# Patient Record
Sex: Female | Born: 1970 | Race: Black or African American | Hispanic: No | Marital: Single | Smoking: Never smoker
Health system: Southern US, Community
[De-identification: ages and names within clinical notes are randomized; demographics above are authoritative.]

## PROBLEM LIST (undated history)

## (undated) DIAGNOSIS — F419 Anxiety disorder, unspecified: Secondary | ICD-10-CM

## (undated) DIAGNOSIS — J45909 Unspecified asthma, uncomplicated: Secondary | ICD-10-CM

## (undated) DIAGNOSIS — I10 Essential (primary) hypertension: Secondary | ICD-10-CM

## (undated) HISTORY — PX: KNEE SURGERY: SHX244

## (undated) HISTORY — PX: ANKLE SURGERY: SHX546

---

## 2013-10-26 ENCOUNTER — Emergency Department: Payer: Self-pay | Admitting: Emergency Medicine

## 2013-10-26 LAB — CBC
HCT: 42.5 % (ref 35.0–47.0)
HGB: 13.2 g/dL (ref 12.0–16.0)
MCH: 25 pg — ABNORMAL LOW (ref 26.0–34.0)
MCHC: 31.1 g/dL — AB (ref 32.0–36.0)
MCV: 80 fL (ref 80–100)
Platelet: 278 10*3/uL (ref 150–440)
RBC: 5.3 10*6/uL — ABNORMAL HIGH (ref 3.80–5.20)
RDW: 16.9 % — AB (ref 11.5–14.5)
WBC: 13 10*3/uL — ABNORMAL HIGH (ref 3.6–11.0)

## 2013-10-26 LAB — COMPREHENSIVE METABOLIC PANEL
ALK PHOS: 190 U/L — AB
ALT: 28 U/L (ref 12–78)
Albumin: 4 g/dL (ref 3.4–5.0)
Anion Gap: 7 (ref 7–16)
BUN: 9 mg/dL (ref 7–18)
Bilirubin,Total: 0.2 mg/dL (ref 0.2–1.0)
CREATININE: 0.88 mg/dL (ref 0.60–1.30)
Calcium, Total: 9.2 mg/dL (ref 8.5–10.1)
Chloride: 101 mmol/L (ref 98–107)
Co2: 28 mmol/L (ref 21–32)
EGFR (African American): >60
Glucose: 137 mg/dL — ABNORMAL HIGH (ref 65–99)
Osmolality: 273 (ref 275–301)
Potassium: 4.1 mmol/L (ref 3.5–5.1)
SGOT(AST): 25 U/L (ref 15–37)
SODIUM: 136 mmol/L (ref 136–145)
Total Protein: 9.2 g/dL — ABNORMAL HIGH (ref 6.4–8.2)

## 2013-10-26 LAB — TROPONIN I: Troponin-I: <0.02 ng/mL

## 2014-05-03 ENCOUNTER — Emergency Department: Payer: Self-pay | Admitting: Emergency Medicine

## 2014-05-03 LAB — CBC WITH DIFFERENTIAL/PLATELET
BASOS ABS: 0.1 10*3/uL (ref 0.0–0.1)
BASOS PCT: 0.6 %
EOS PCT: 2 %
Eosinophil #: 0.3 10*3/uL (ref 0.0–0.7)
HCT: 42 % (ref 35.0–47.0)
HGB: 12.6 g/dL (ref 12.0–16.0)
LYMPHS ABS: 5 10*3/uL — AB (ref 1.0–3.6)
Lymphocyte %: 36.6 %
MCH: 25 pg — ABNORMAL LOW (ref 26.0–34.0)
MCHC: 30 g/dL — ABNORMAL LOW (ref 32.0–36.0)
MCV: 83 fL (ref 80–100)
Monocyte #: 1.1 x10 3/mm — ABNORMAL HIGH (ref 0.2–0.9)
Monocyte %: 8.2 %
NEUTROS PCT: 52.6 %
Neutrophil #: 7.2 10*3/uL — ABNORMAL HIGH (ref 1.4–6.5)
PLATELETS: 342 10*3/uL (ref 150–440)
RBC: 5.03 10*6/uL (ref 3.80–5.20)
RDW: 16.5 % — AB (ref 11.5–14.5)
WBC: 13.6 10*3/uL — ABNORMAL HIGH (ref 3.6–11.0)

## 2014-05-03 LAB — BASIC METABOLIC PANEL
ANION GAP: 9 (ref 7–16)
BUN: 12 mg/dL (ref 7–18)
CALCIUM: 8.5 mg/dL (ref 8.5–10.1)
Chloride: 105 mmol/L (ref 98–107)
Co2: 28 mmol/L (ref 21–32)
Creatinine: 0.94 mg/dL (ref 0.60–1.30)
EGFR (African American): >60
EGFR (Non-African Amer.): >60
GLUCOSE: 118 mg/dL — AB (ref 65–99)
Osmolality: 284 (ref 275–301)
Potassium: 3.7 mmol/L (ref 3.5–5.1)
Sodium: 142 mmol/L (ref 136–145)

## 2014-05-03 LAB — D-DIMER(ARMC): D-Dimer: 1050 ng/ml

## 2014-05-03 LAB — TROPONIN I: Troponin-I: <0.02 ng/mL

## 2014-05-03 LAB — HCG, QUANTITATIVE, PREGNANCY: Beta Hcg, Quant.: <1 m[IU]/mL — ABNORMAL LOW

## 2016-03-08 ENCOUNTER — Encounter: Payer: Self-pay | Admitting: Emergency Medicine

## 2016-03-08 ENCOUNTER — Emergency Department
Admission: EM | Admit: 2016-03-08 | Discharge: 2016-03-09 | Disposition: A | Discharge status: Home / Self Care | Payer: BC Managed Care – PPO | Attending: Emergency Medicine | Admitting: Emergency Medicine

## 2016-03-08 DIAGNOSIS — J45909 Unspecified asthma, uncomplicated: Secondary | ICD-10-CM | POA: Diagnosis not present

## 2016-03-08 DIAGNOSIS — I10 Essential (primary) hypertension: Secondary | ICD-10-CM | POA: Insufficient documentation

## 2016-03-08 DIAGNOSIS — R109 Unspecified abdominal pain: Secondary | ICD-10-CM | POA: Diagnosis not present

## 2016-03-08 HISTORY — DX: Unspecified asthma, uncomplicated: J45.909

## 2016-03-08 HISTORY — DX: Anxiety disorder, unspecified: F41.9

## 2016-03-08 HISTORY — DX: Essential (primary) hypertension: I10

## 2016-03-08 LAB — URINALYSIS COMPLETE WITH MICROSCOPIC (ARMC ONLY)
BILIRUBIN URINE: NEGATIVE
Bacteria, UA: NONE SEEN
GLUCOSE, UA: NEGATIVE mg/dL
HGB URINE DIPSTICK: NEGATIVE
Ketones, ur: NEGATIVE mg/dL
LEUKOCYTES UA: NEGATIVE
NITRITE: NEGATIVE
PH: 7 (ref 5.0–8.0)
Protein, ur: 30 mg/dL — AB
SPECIFIC GRAVITY, URINE: 1.017 (ref 1.005–1.030)

## 2016-03-08 LAB — CBC
HCT: 37.9 % (ref 35.0–47.0)
Hemoglobin: 12.3 g/dL (ref 12.0–16.0)
MCH: 24.8 pg — AB (ref 26.0–34.0)
MCHC: 32.5 g/dL (ref 32.0–36.0)
MCV: 76.4 fL — AB (ref 80.0–100.0)
PLATELETS: 280 10*3/uL (ref 150–440)
RBC: 4.97 MIL/uL (ref 3.80–5.20)
RDW: 18.5 % — AB (ref 11.5–14.5)
WBC: 12 10*3/uL — ABNORMAL HIGH (ref 3.6–11.0)

## 2016-03-08 LAB — BASIC METABOLIC PANEL
Anion gap: 5 (ref 5–15)
BUN: 9 mg/dL (ref 6–20)
CHLORIDE: 105 mmol/L (ref 101–111)
CO2: 29 mmol/L (ref 22–32)
CREATININE: 0.76 mg/dL (ref 0.44–1.00)
Calcium: 9.1 mg/dL (ref 8.9–10.3)
GFR calc Af Amer: >60 mL/min (ref >60)
GFR calc non Af Amer: >60 mL/min (ref >60)
GLUCOSE: 97 mg/dL (ref 65–99)
Potassium: 3.6 mmol/L (ref 3.5–5.1)
Sodium: 139 mmol/L (ref 135–145)

## 2016-03-08 NOTE — ED Triage Notes (Signed)
Patient with complaint of right lower back pain that she has had times 2 weeks. Patient reports that the pain is becoming worse. Patient states that she was seen at an emergency room about 2 weeks ago and by her pcp one week ago and was diagnosed with flank pain.

## 2016-03-09 ENCOUNTER — Emergency Department: Payer: BC Managed Care – PPO

## 2016-03-09 NOTE — ED Provider Notes (Signed)
[facility] Emergency Department Provider Note   First MD Initiated Contact with Patient 03/09/16 [phone]     (approximate)  I have reviewed the triage vital signs and the nursing notes.   HISTORY  Chief Complaint Back Pain  HPI Ramisa Senaida OresRichardson is a 45 y.o. female resents with 8 out of 10 right flank pain that is described as aching 2 weeks. Patient denies any history of trauma. Patient states that she was seen in the emergency department [facility] 2 weeks ago and subsequently by her primary care provider one week ago for the same and diagnosed with right flank pain. Patient states she was prescribed naproxen and Skelaxin however she did not take the Skelaxin because she was concerned that this might worsen her anxiety Past Medical History:  Diagnosis Date  . Anxiety   . Asthma   . Hypertension     There are no active problems to display for this patient.   Past Surgical History:  Procedure Laterality Date  . ANKLE SURGERY    . KNEE SURGERY      Prior to Admission medications   Not on File    Allergies Shellfish allergy  No family history on file.  Social History Social History  Substance Use Topics  . Smoking status: Never Smoker  . Smokeless tobacco: Never Used  . Alcohol use No    Review of Systems Constitutional: No fever/chills Eyes: No visual changes. ENT: No sore throat. Cardiovascular: Denies chest pain. Respiratory: Denies shortness of breath. Gastrointestinal: No abdominal pain.  No nausea, no vomiting.  No diarrhea.  No constipation. Genitourinary: Negative for dysuria. Musculoskeletal:Positive for right flank pain Skin: Negative for rash. Neurological: Negative for headaches, focal weakness or numbness.  10-point ROS otherwise negative.  ____________________________________________   PHYSICAL EXAM:  VITAL SIGNS: ED Triage Vitals  Enc Vitals Group     BP 03/08/16 2135 (!) 171/110     Pulse Rate 03/08/16 2135 95       Resp 03/08/16 2135 18     Temp 03/08/16 2135 98.1 F (36.7 C)     Temp Source 03/08/16 2135 Oral     SpO2 03/08/16 2135 97 %     Weight 03/08/16 2135 262 lb (118.8 kg)     Height 03/08/16 2135 5\' 1"  (1.549 m)     Head Circumference --      Peak Flow --      Pain Score 03/08/16 2136 8     Pain Loc --      Pain Edu? --      Excl. in GC? --     Constitutional: Alert and oriented. Well appearing and in no acute distress. Eyes: Conjunctivae are normal. PERRL. EOMI. Head: Atraumatic. Mouth/Throat: Mucous membranes are moist.  Oropharynx non-erythematous. Neck: No stridor.  No meningeal signs.  Cardiovascular: Normal rate, regular rhythm. Good peripheral circulation. Grossly normal heart sounds. Respiratory: Normal respiratory effort.  No retractions. Lungs CTAB. Gastrointestinal: Soft and nontender. No distention.  Musculoskeletal: No lower extremity tenderness nor edema. No gross deformities of extremities. Neurologic:  Normal speech and language. No gross focal neurologic deficits are appreciated.  Skin:  Skin is warm, dry and intact. No rash noted. Psychiatric: Mood and affect are normal. Speech and behavior are normal.  ____________________________________________   LABS (all labs ordered are listed, but only abnormal results are displayed)  Labs Reviewed  URINALYSIS COMPLETEWITH MICROSCOPIC ([facility] ONLY) - Abnormal; Notable for the following:       Result Value  Color, Urine YELLOW (*)    APPearance CLEAR (*)    Protein, ur 30 (*)    Squamous Epithelial / LPF 0-5 (*)    All other components within normal limits  CBC - Abnormal; Notable for the following:    WBC 12.0 (*)    MCV 76.4 (*)    MCH 24.8 (*)    RDW 18.5 (*)    All other components within normal limits  BASIC METABOLIC PANEL    RADIOLOGY I, [place] N Chasitie Passey, personally viewed and evaluated these images (plain radiographs) as part of my medical decision making, as well as reviewing the written report by  the radiologist.  Ct Renal Stone Study  Result Date: 03/09/2016 CLINICAL DATA:  Acute onset of right back pain.  Initial encounter. EXAM: CT ABDOMEN AND PELVIS WITHOUT CONTRAST TECHNIQUE: Multidetector CT imaging of the abdomen and pelvis was performed following the standard protocol without IV contrast. COMPARISON:  None. FINDINGS: Lower chest: The visualized lung bases are grossly clear. The visualized portions of the mediastinum are unremarkable. Hepatobiliary: The liver is unremarkable in appearance. The gallbladder is unremarkable in appearance. The common bile duct remains normal in caliber. Pancreas: The pancreas is grossly unremarkable in appearance. Spleen: The spleen is within normal limits. Adrenals/Urinary Tract: The adrenal glands are within normal limits. A 5.1 cm left renal cyst is noted. The kidneys are otherwise unremarkable. There is no evidence of hydronephrosis. No renal or ureteral stones are identified. No perinephric stranding is appreciated. Stomach/Bowel: The colon is unremarkable in appearance. The small bowel is within normal limits. The appendix is normal in caliber, without evidence of appendicitis. The colon is largely decompressed and is grossly unremarkable in appearance. Vascular/Lymphatic: No acute vascular abnormalities are seen, though evaluation of the vasculature is limited without contrast. No retroperitoneal lymphadenopathy is seen. No pelvic sidewall lymphadenopathy is appreciated. Reproductive: The bladder is unremarkable in appearance. The uterus is grossly unremarkable. The ovaries are relatively symmetric. No suspicious adnexal masses are seen. Other: Minimal stranding along the anterior abdominal wall may be postoperative in nature. Musculoskeletal: No acute osseous abnormalities are identified. The visualized musculature is grossly unremarkable. IMPRESSION: 1. No acute abnormality seen within the abdomen or pelvis. 2. Large left renal cyst noted. Electronically  Signed   By: Roanna Raider M.D.   On: 03/09/2016 03:11     Procedures      INITIAL IMPRESSION / ASSESSMENT AND PLAN / ED COURSE  Pertinent labs & imaging results that were available during my care of the patient were reviewed by me and considered in my medical decision making (see chart for details).     Clinical Course    ____________________________________________  FINAL CLINICAL IMPRESSION(S) / ED DIAGNOSES  Final diagnoses:  Right flank pain     MEDICATIONS GIVEN DURING THIS VISIT:  Medications - No data to display   NEW OUTPATIENT MEDICATIONS STARTED DURING THIS VISIT:  New Prescriptions   No medications on file    Modified Medications   No medications on file    Discontinued Medications   No medications on file     Note:  This document was prepared using Dragon voice recognition software and may include unintentional dictation errors.    Darci Current, MD 03/10/16 [phone]

## 2016-03-09 NOTE — ED Notes (Signed)
Pt c/o continued R back pain.  Pt sts she has been taking naproxen and using lidocaine patch, but not taken muscle relaxer b/c worried it will incr her anxiety.  Pt sts her pain has gradually worsened, dull ache w/ intermittent sharp pain. Sts pain is better when she is moving around.  Denies any injury or incr physical labor.  Pt denies CP,SOB or LOC.  NAD.

## 2016-03-09 NOTE — ED Notes (Signed)
MD Brown at bedside.

## 2017-06-04 ENCOUNTER — Other Ambulatory Visit: Payer: Self-pay

## 2017-06-04 ENCOUNTER — Encounter: Payer: Self-pay | Admitting: Emergency Medicine

## 2017-06-04 ENCOUNTER — Emergency Department
Admission: EM | Admit: 2017-06-04 | Discharge: 2017-06-04 | Disposition: A | Discharge status: Home / Self Care | Payer: BC Managed Care – PPO | Attending: Emergency Medicine | Admitting: Emergency Medicine

## 2017-06-04 DIAGNOSIS — M549 Dorsalgia, unspecified: Secondary | ICD-10-CM | POA: Diagnosis not present

## 2017-06-04 DIAGNOSIS — M778 Other enthesopathies, not elsewhere classified: Secondary | ICD-10-CM | POA: Diagnosis not present

## 2017-06-04 DIAGNOSIS — I10 Essential (primary) hypertension: Secondary | ICD-10-CM | POA: Diagnosis not present

## 2017-06-04 DIAGNOSIS — J45909 Unspecified asthma, uncomplicated: Secondary | ICD-10-CM | POA: Insufficient documentation

## 2017-06-04 LAB — URINALYSIS, COMPLETE (UACMP) WITH MICROSCOPIC
BILIRUBIN URINE: NEGATIVE
Bacteria, UA: NONE SEEN
GLUCOSE, UA: NEGATIVE mg/dL
KETONES UR: NEGATIVE mg/dL
Nitrite: NEGATIVE
PROTEIN: 30 mg/dL — AB
Specific Gravity, Urine: 1.019 (ref 1.005–1.030)
pH: 6 (ref 5.0–8.0)

## 2017-06-04 LAB — POCT PREGNANCY, URINE: Preg Test, Ur: NEGATIVE

## 2017-06-04 MED ORDER — MELOXICAM 15 MG PO TABS: 15.0000 mg | ORAL_TABLET | Freq: Every day | ORAL | 2 refills | Status: AC

## 2017-06-04 MED ORDER — BACLOFEN 10 MG PO TABS: 10.0000 mg | ORAL_TABLET | Freq: Every day | ORAL | 1 refills | Status: AC

## 2017-06-04 NOTE — ED Provider Notes (Signed)
[facility] Emergency Department Provider Note  ____________________________________________   First MD Initiated Contact with Patient 06/04/17 1302     (approximate)  I have reviewed the triage vital signs and the nursing notes.   HISTORY  Chief Complaint Back Pain and Wrist Pain    HPI Toluwanimi Senaida OresRichardson is a 46 y.o. female complains of right-sided mid back pain, she denies any injury, states pain started 1-2 weeks ago, she took over-the-counter medicines without any relief, she is also complaining of left wrist pain, states it hurts along the top of her thumb, no known injury, someone at work told her maybe she has carpal tunnel syndrome, yesterday she had a pelvic exam done with her regular doctor and they removed a polyp from the uterus, her urine had a small amount of blood in it yesterday after the procedure, she denies fever, chills, abdominal pain, or history of kidney stones   Past Medical History:  Diagnosis Date  . Anxiety   . Asthma   . Hypertension     There are no active problems to display for this patient.   Past Surgical History:  Procedure Laterality Date  . ANKLE SURGERY    . KNEE SURGERY      Prior to Admission medications   Medication Sig Start Date End Date Taking? Authorizing Provider  baclofen (LIORESAL) 10 MG tablet Take 1 tablet (10 mg total) by mouth daily. 06/04/17 06/04/18  Fisher, Roselyn BeringSusan W, PA-C  meloxicam (MOBIC) 15 MG tablet Take 1 tablet (15 mg total) by mouth daily. 06/04/17 06/04/18  Faythe GheeFisher, Susan W, PA-C    Allergies Shellfish allergy  History reviewed. No pertinent family history.  Social History Social History   Tobacco Use  . Smoking status: Never Smoker  . Smokeless tobacco: Never Used  Substance Use Topics  . Alcohol use: No  . Drug use: No    Review of Systems  Constitutional: No fever/chills Eyes: No visual changes. ENT: No sore throat. Respiratory: Denies cough Genitourinary: Negative  for dysuria. Negative for hematuria Musculoskeletal: Positive for back pain. Positive for left wrist pain Skin: Negative for rash.    ____________________________________________   PHYSICAL EXAM:  VITAL SIGNS: ED Triage Vitals  Enc Vitals Group     BP 06/04/17 1235 (!) 140/96     Pulse Rate 06/04/17 1235 79     Resp 06/04/17 1235 16     Temp 06/04/17 1235 97.6 F (36.4 C)     Temp Source 06/04/17 1235 Oral     SpO2 06/04/17 1235 96 %     Weight 06/04/17 1235 275 lb (124.7 kg)     Height 06/04/17 1235 5\' 1"  (1.549 m)     Head Circumference --      Peak Flow --      Pain Score 06/04/17 1240 6     Pain Loc --      Pain Edu? --      Excl. in GC? --     Constitutional: Alert and oriented. Well appearing and in no acute distress. Eyes: Conjunctivae are normal.  Head: Atraumatic. Nose: No congestion/rhinnorhea. Mouth/Throat: Mucous membranes are moist.   Cardiovascular: Normal rate, regular rhythm. Heart sounds are normal Respiratory: Normal respiratory effort.  No retractions, as are clear to auscultation GU: deferred Musculoskeletal: FROM all extremities, warm and well perfused, thumb is tender along the volar aspect, spine is nontender, flank area is a little tender, patient has full range of motion Neurologic:  Normal speech and language.  Skin:  Skin is warm, dry and intact. No rash noted. Psychiatric: Mood and affect are normal. Speech and behavior are normal.  ____________________________________________   LABS (all labs ordered are listed, but only abnormal results are displayed)  Labs Reviewed  URINALYSIS, COMPLETE (UACMP) WITH MICROSCOPIC - Abnormal; Notable for the following components:      Result Value   Color, Urine YELLOW (*)    APPearance HAZY (*)    Hgb urine dipstick MODERATE (*)    Protein, ur 30 (*)    Leukocytes, UA TRACE (*)    Squamous Epithelial / LPF 6-30 (*)    All other components within normal limits  URINE CULTURE  POC URINE PREG, ED    POCT PREGNANCY, URINE   ____________________________________________   ____________________________________________  RADIOLOGY    ____________________________________________   PROCEDURES  Procedure(s) performed: No      ____________________________________________   INITIAL IMPRESSION / ASSESSMENT AND PLAN / ED COURSE  Pertinent labs & imaging results that were available during my care of the patient were reviewed by me and considered in my medical decision making (see chart for details).  Should his 46 year old female with mid back pain and left wrist pain, diagnosis is mid back pain with tendinitis of the left thumb, she refused a injection of Toradol, a prescription for meloxicam 15 mg per day and baclofen 10 mg 3 times a day was given to the patient, she was instructed to use wet heat and ice, there was a trace of leukocytes in the urine, most likely this is due to the procedure from yesterday, a urine culture was ordered, the patient was told that she would be called if the urine culture grew out any bacteria, if it does someone from the emergency department will call in an antibiotic, patient states she understands and agrees with treatment plan      ____________________________________________   FINAL CLINICAL IMPRESSION(S) / ED DIAGNOSES  Final diagnoses:  Mid back pain on right side  Wrist tendonitis      NEW MEDICATIONS STARTED DURING THIS VISIT:  This SmartLink is deprecated. Use AVSMEDLIST instead to display the medication list for a patient.   Note:  This document was prepared using Dragon voice recognition software and may include unintentional dictation errors.    Faythe GheeFisher, Susan W, PA-C 06/04/17 1528    Emily FilbertWilliams, Jonathan E, MD 06/04/17 [phone]

## 2017-06-04 NOTE — ED Notes (Signed)
Pt reports right sided mid back pain - denies injury - pain started 1-2 weeks ago - pt has had this pain before and it went away without tx Left wrist pain - no injury - pain started 1 month ago

## 2017-06-04 NOTE — ED Triage Notes (Signed)
Pt to ED from home c/o right mid back pain and left wrist pain.  Denies urinary symptoms, n/v/d, or injury.

## 2017-06-04 NOTE — Discharge Instructions (Signed)
Follow up with your regular doctor or orthopedics if you're not better in one week, use medication as prescribed, apply ice to your back and your wrist, we ordered a urine culture on your urine, if it shows bacteria they will call in an antibiotic for you, change emergency department if you're worsening

## 2017-06-05 LAB — URINE CULTURE: Culture: NO GROWTH

## 2017-06-07 IMAGING — CT CT RENAL STONE PROTOCOL
2 of 4 series · 17 of 46 positions shown, 19 images · non-contrast
Comparison: None.

CLINICAL DATA: Acute onset of right back pain.  Initial encounter.

EXAM:
CT ABDOMEN AND PELVIS WITHOUT CONTRAST
TECHNIQUE: Multidetector CT imaging of the abdomen and pelvis was performed
following the standard protocol without IV contrast.

[Series 2: axial st · axial · 0.83mm/px · z∈[-993,-598]mm · 14 of 87 slices shown, 16 images]
[im 4/87  soft-tissue]
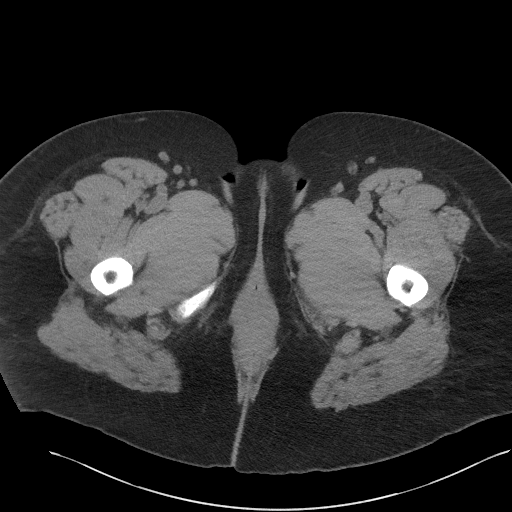
[im 4/87  bone]
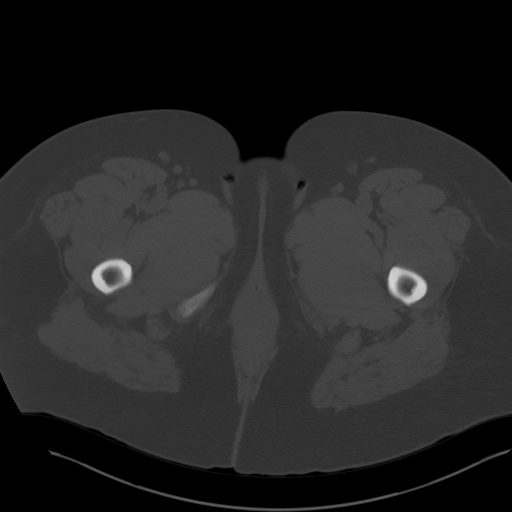
[im 12/87  soft-tissue]
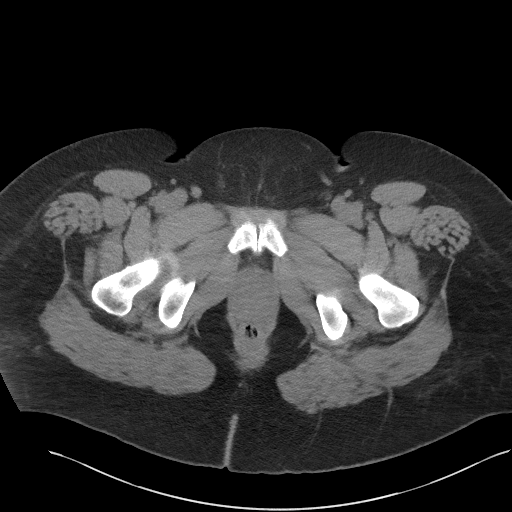
[im 15/87  soft-tissue]
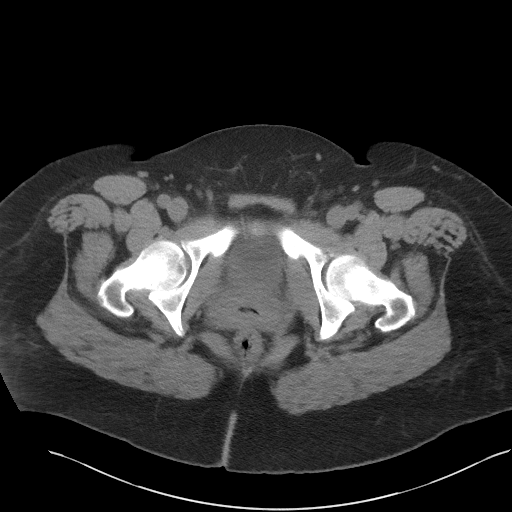
[im 23/87  soft-tissue]
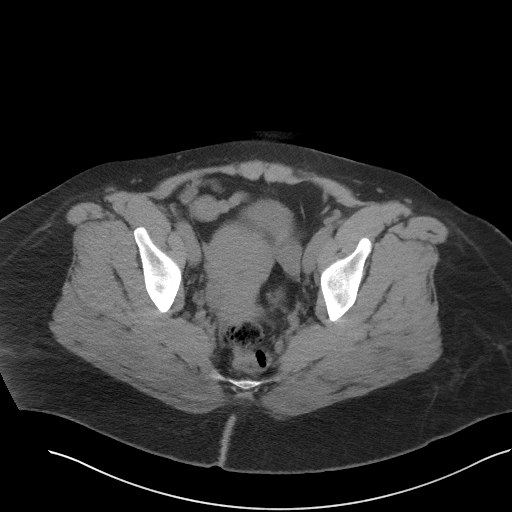
[im 30/87  soft-tissue]
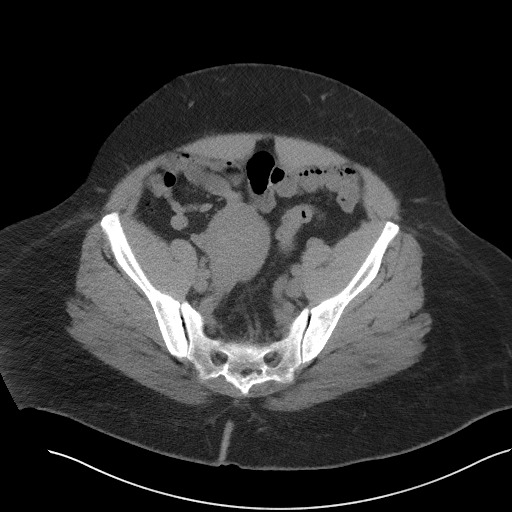
[im 34/87  soft-tissue]
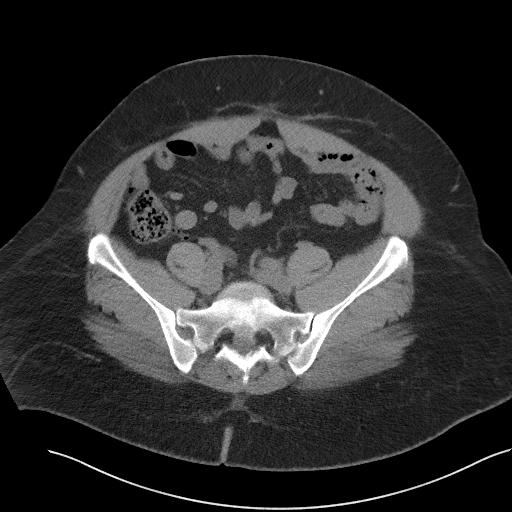
[im 42/87  soft-tissue]
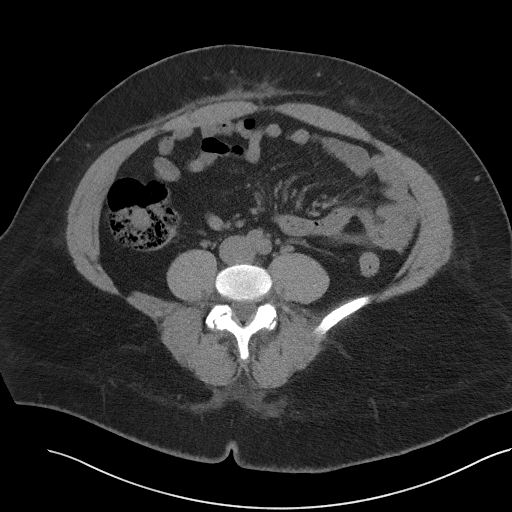
[im 45/87  soft-tissue]
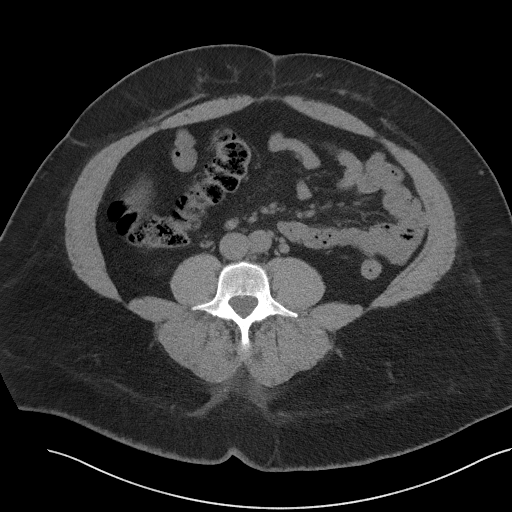
[im 53/87  soft-tissue]
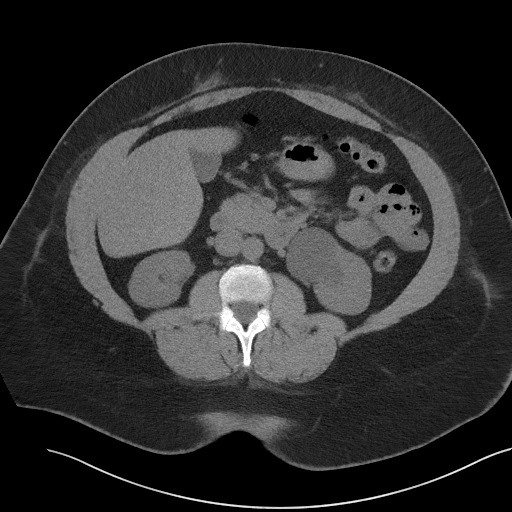
[im 53/87  bone]
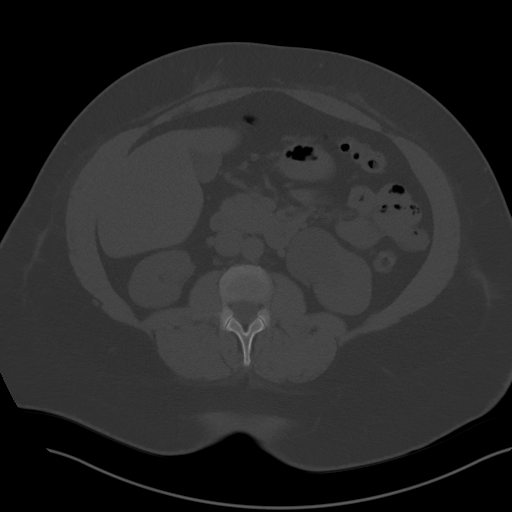
[im 57/87  soft-tissue]
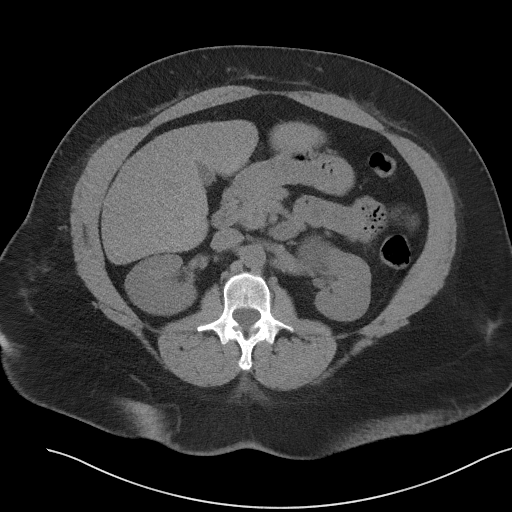
[im 64/87  soft-tissue]
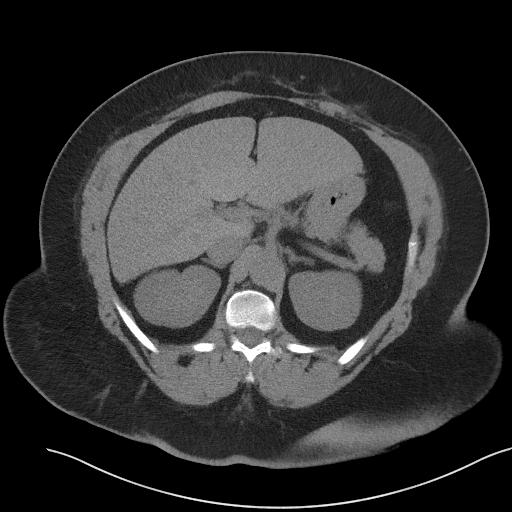
[im 72/87  soft-tissue]
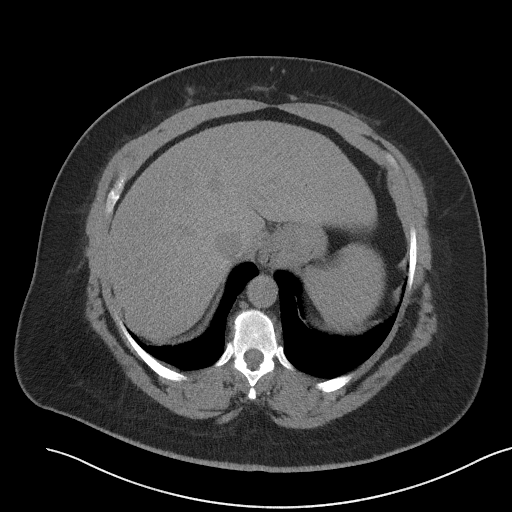
[im 75/87  soft-tissue]
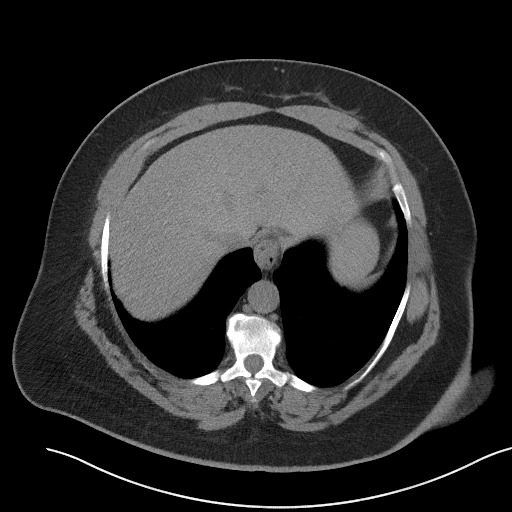
[im 83/87  soft-tissue]
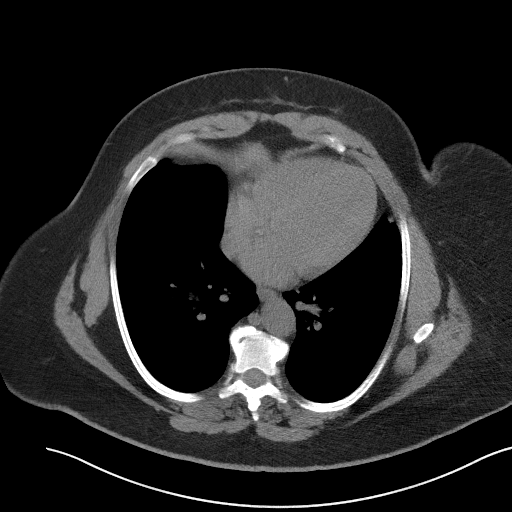

[Series 5: coronal · coronal · 0.86mm/px · 3 of 172 slices shown]
[im 58/172  soft-tissue]
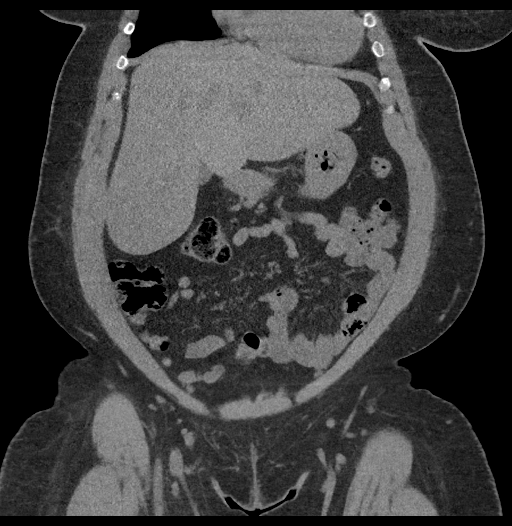
[im 77/172  soft-tissue]
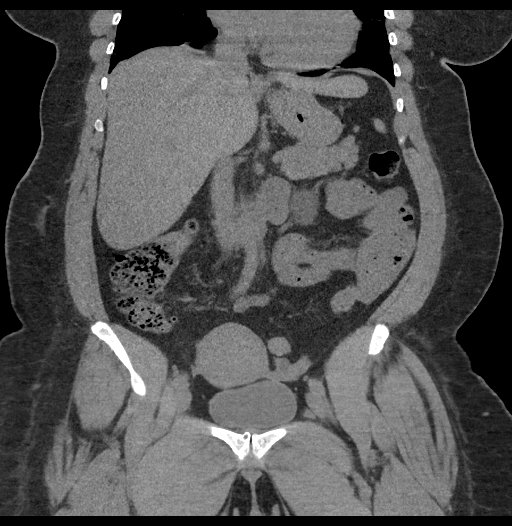
[im 96/172  soft-tissue]
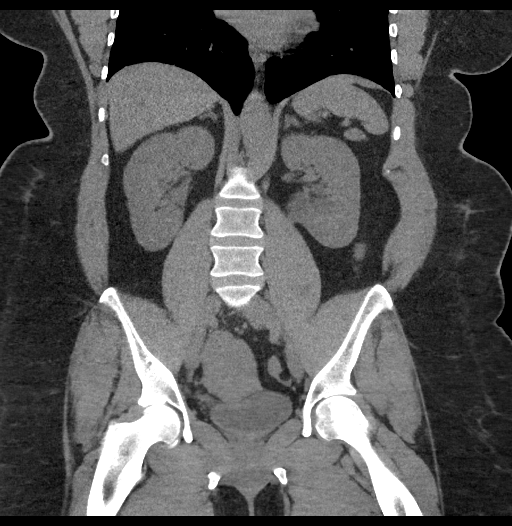

[17 of 46 positions shown; findings below may reference images not displayed]

FINDINGS: Lower chest: The visualized lung bases are grossly clear. The
visualized portions of the mediastinum are unremarkable.

Hepatobiliary: The liver is unremarkable in appearance. The
gallbladder is unremarkable in appearance. The common bile duct
remains normal in caliber.

Pancreas: The pancreas is grossly unremarkable in appearance.

Spleen: The spleen is within normal limits.

Adrenals/Urinary Tract: The adrenal glands are within normal limits.
A 5.1 cm left renal cyst is noted. The kidneys are otherwise
unremarkable. There is no evidence of hydronephrosis. No renal or
ureteral stones are identified. No perinephric stranding is
appreciated.

Stomach/Bowel: The colon is unremarkable in appearance. The small
bowel is within normal limits. The appendix is normal in caliber,
without evidence of appendicitis. The colon is largely decompressed
and is grossly unremarkable in appearance.

Vascular/Lymphatic: No acute vascular abnormalities are seen, though
evaluation of the vasculature is limited without contrast. No
retroperitoneal lymphadenopathy is seen. No pelvic sidewall
lymphadenopathy is appreciated.

Reproductive: The bladder is unremarkable in appearance. The uterus
is grossly unremarkable. The ovaries are relatively symmetric. No
suspicious adnexal masses are seen.

Other: Minimal stranding along the anterior abdominal wall may be
postoperative in nature.

Musculoskeletal: No acute osseous abnormalities are identified. The
visualized musculature is grossly unremarkable.
IMPRESSION: 1. No acute abnormality seen within the abdomen or pelvis.
2. Large left renal cyst noted.
